# Patient Record
Sex: Male | Born: 1935 | Race: White | Marital: Married | Smoking: Former smoker
Health system: Southern US, Community
[De-identification: ages and names within clinical notes are randomized; demographics above are authoritative.]

## PROBLEM LIST (undated history)

## (undated) DIAGNOSIS — E7800 Pure hypercholesterolemia, unspecified: Secondary | ICD-10-CM

## (undated) DIAGNOSIS — I10 Essential (primary) hypertension: Secondary | ICD-10-CM

## (undated) DIAGNOSIS — J329 Chronic sinusitis, unspecified: Secondary | ICD-10-CM

## (undated) DIAGNOSIS — Z87438 Personal history of other diseases of male genital organs: Secondary | ICD-10-CM

## (undated) DIAGNOSIS — N529 Male erectile dysfunction, unspecified: Secondary | ICD-10-CM

## (undated) DIAGNOSIS — I82409 Acute embolism and thrombosis of unspecified deep veins of unspecified lower extremity: Secondary | ICD-10-CM

## (undated) DIAGNOSIS — C801 Malignant (primary) neoplasm, unspecified: Secondary | ICD-10-CM

## (undated) HISTORY — PX: BLADDER SURGERY: SHX569

---

## 2008-06-16 HISTORY — PX: NASAL POLYP SURGERY: SHX186

## 2019-10-24 ENCOUNTER — Other Ambulatory Visit: Payer: Self-pay | Admitting: Urology

## 2019-10-24 DIAGNOSIS — R972 Elevated prostate specific antigen [PSA]: Secondary | ICD-10-CM

## 2019-11-04 ENCOUNTER — Ambulatory Visit
Admission: RE | Admit: 2019-11-04 | Discharge: 2019-11-04 | Disposition: A | Discharge status: Home / Self Care | Payer: Medicare Other | Source: Ambulatory Visit | Attending: Urology | Admitting: Urology

## 2019-11-04 ENCOUNTER — Other Ambulatory Visit: Payer: Self-pay

## 2019-11-04 DIAGNOSIS — R972 Elevated prostate specific antigen [PSA]: Secondary | ICD-10-CM | POA: Diagnosis present

## 2019-11-04 MED ORDER — GADOBUTROL 1 MMOL/ML IV SOLN
7.0000 mL | Freq: Once | INTRAVENOUS | Status: AC | PRN
  Administered 2019-11-04: 7 mL via INTRAVENOUS

## 2019-12-08 NOTE — H&P (Signed)
NAMEOPIE, MACLAUGHLIN MEDICAL RECORD DZ:[id number] ACCOUNT [id number] DATE OF BIRTH:08/17/1930 FACILITY: [facility] LOCATION:  PHYSICIAN:Nikolus Marczak Farrel Conners, MD  HISTORY AND PHYSICAL  DATE OF ADMISSION:  12/22/2019  CHIEF COMPLAINT:  Elevated PSA.  HISTORY OF PRESENT ILLNESS:  The patient is an 86 year old white male found to have elevated PSA of 7.0 ng/mL on 06/17/2019.  He had a negative biopsy in 2012 for PSA of 6.1.  He has been on finasteride for at least eight years for BPH.  He was evaluated  in the office with an exosome IntelliScore of 57.87 which was above the cutoff for higher risk of high-grade prostate cancer.  MRI scan was also performed indicating that he had a 46 cc prostate with a 1.8 cm PI-RADS category 5 involving the left side  of the prostate and a 1.6 cm PI-RADS category 5 involving the right side of the prostate.  He comes in now for UroNav fusion biopsy of the prostate.  PAST MEDICAL HISTORY:    ALLERGIES:  SULFA DRUGS.  CURRENT MEDICATIONS:  Coenzyme Q, vitamin C, vitamin D3, lycopene, finasteride, arginine, atorvastatin, prednisone, losartan, and Eliquis.  PAST SURGICAL HISTORY:  Removal of nasal polyps in 2010.  PAST AND CURRENT MEDICAL CONDITIONS: 1.  Hypertension. 2.  Hypercholesterolemia. 3.  History of DVT. 4.  Chronic sinusitis. 5.  BPH. 6.  Erectile dysfunction.    REVIEW OF SYSTEMS:  The patient denies chest pain, shortness of breath, diabetes or stroke.  SOCIAL HISTORY:  The patient denied tobacco use.  He quit smoking in 1968 with a 10-pack-year history.  He consumes 7 alcoholic beverages per week.  FAMILY HISTORY:  Negative for prostate cancer.  PHYSICAL EXAMINATION: VITAL SIGNS:  Height 5 feet 8 inches, weight 154 pounds, BMI 23. GENERAL:  Well-nourished, white male in no acute distress. HEENT:  Sclerae were clear.  Pupils are equally round, reactive to light and accommodation.  Extraocular movements were intact. NECK:  No palpable masses or  tenderness.  Thyroid gland was smooth without palpable nodules.  No audible carotid bruits. LYMPHATIC:  No palpable inguinal or cervical adenopathy. PULMONARY:  Lungs clear to auscultation. CARDIOVASCULAR:  Regular rhythm and rate without audible murmurs. ABDOMEN:  Soft, nontender abdomen.  No CVA tenderness. GENITOURINARY:  Circumcised.  Testes were smooth, nontender, 20 mL in size each. RECTAL:  A 25 gram smooth, nontender prostate. NEUROMUSCULAR:  Alert and oriented x3.  IMPRESSION: 1.  Elevated PSA. 2.  Two PI-RADS category 5 lesion of the prostate.  PLAN:  UroNav fusion biopsy of the prostate.  CN/NUANCE  D:12/07/2019 T:12/07/2019 JOB:[id number]

## 2019-12-16 ENCOUNTER — Other Ambulatory Visit
Admission: RE | Admit: 2019-12-16 | Discharge: 2019-12-16 | Disposition: A | Discharge status: Home / Self Care | Payer: Medicare Other | Source: Ambulatory Visit | Attending: Urology | Admitting: Urology

## 2019-12-16 ENCOUNTER — Other Ambulatory Visit: Payer: Self-pay

## 2019-12-16 HISTORY — DX: Pure hypercholesterolemia, unspecified: E78.00

## 2019-12-16 HISTORY — DX: Acute embolism and thrombosis of unspecified deep veins of unspecified lower extremity: I82.409

## 2019-12-16 HISTORY — DX: Malignant (primary) neoplasm, unspecified: C80.1

## 2019-12-16 HISTORY — DX: Essential (primary) hypertension: I10

## 2019-12-16 HISTORY — DX: Male erectile dysfunction, unspecified: N52.9

## 2019-12-16 HISTORY — DX: Chronic sinusitis, unspecified: J32.9

## 2019-12-16 HISTORY — DX: Personal history of other diseases of male genital organs: Z87.438

## 2019-12-16 NOTE — Patient Instructions (Addendum)
COVID TESTING Date: December 20, 2019 Testing site:  [facility] [address] Thru Hours:  0:73 am - 1:00 pm Once you are tested, you are asked to stay quarantined (avoiding public places) until after your surgery.   Your procedure is scheduled on: December 22, 2019 THURSDAY Report to Day Surgery on the 2nd floor of the [facility]. To find out your arrival time, please call [phone] between 1PM - 3PM on: Wednesday December 21, 2019  REMEMBER: Instructions that are not followed completely may result in serious medical risk, up to and including death; or upon the discretion of your surgeon and anesthesiologist your surgery may need to be rescheduled.  Do not eat food after midnight the night before surgery.  No gum chewing, lozengers or hard candies.  You may however, drink CLEAR liquids up to 2 hours before you are scheduled to arrive for your surgery. Do not drink anything within 2 hours of your scheduled arrival time.  Clear liquids include: - water  - apple juice without pulp - gatorade (not RED) - black coffee or tea (Do NOT add milk or creamers to the coffee or tea) Do NOT drink anything that is not on this list.  Type 1 and Type 2 diabetics should only drink water.  ENSURE PRE-SURGERY CARBOHYDRATE DRINK:  Complete drinking 2 hours prior to scheduled arrival time.  TAKE THESE MEDICATIONS THE MORNING OF SURGERY WITH A SIP OF WATER: PREDNISONE   Follow recommendations from Cardiologist, Pulmonologist or PCP regarding stopping Eliquis -STOP ON December 14, 2019  Stop Anti-inflammatories (NSAIDS) such as Advil, Aleve, Ibuprofen, Motrin, Naproxen, Naprosyn and Aspirin based products such as Excedrin, Goodys Powder, BC Powder. (May take Tylenol or Acetaminophen if needed.)  Stop ANY OVER THE COUNTER supplements until after surgery. STOP ALL OTHER SUPPLEMENT (May continue Vitamin D, Vitamin B, and multivitamin.)  No Alcohol for 24 hours before or after  surgery.  No Smoking including e-cigarettes for 24 hours prior to surgery.  No chewable tobacco products for at least 6 hours prior to surgery.  No nicotine patches on the day of surgery.  Do not use any "recreational" drugs for at least a week prior to your surgery.  Please be advised that the combination of cocaine and anesthesia may have negative outcomes, up to and including death. If you test positive for cocaine, your surgery will be cancelled.  On the morning of surgery brush your teeth with toothpaste and water, you may rinse your mouth with mouthwash if you wish. Do not swallow any toothpaste or mouthwash.  Do not wear jewelry, make-up, hairpins, clips or nail polish.  Do not wear lotions, powders, or perfumes.   Do not shave 48 hours prior to surgery.   Contact lenses, hearing aids and dentures may not be worn into surgery.  Do not bring valuables to the hospital. [facility] is not responsible for any missing/lost belongings or valuables.   SHOWER MORNING OF SURGERY.  Fleets enema as directed.  Bring your C-PAP to the hospital with you in case you may have to spend the night.   Notify your doctor if there is any change in your medical condition (cold, fever, infection).  Wear comfortable clothing (specific to your surgery type) to the hospital.  Plan for stool softeners for home use; pain medications have a tendency to cause constipation. You can also help prevent constipation by eating foods high in fiber such as fruits and vegetables and drinking plenty of fluids  as your diet allows.  After surgery, you can help prevent lung complications by doing breathing exercises.  Take deep breaths and cough every 1-2 hours. Your doctor may order a device called an Incentive Spirometer to help you take deep breaths. When coughing or sneezing, hold a pillow firmly against your incision with both hands. This is called "splinting." Doing this helps protect your incision. It also  decreases belly discomfort.  If you are being discharged the day of surgery, you will not be allowed to drive home. You will need a responsible adult (18 years or older) to drive you home and stay with you that night.   If you are taking public transportation, you will need to have a responsible adult (18 years or older) with you. Please confirm with your physician that it is acceptable to use public transportation.   Please call the [place] [facility] at [phone] if you have any questions about these instructions.  Visitation Policy:  Patients undergoing a surgery or procedure may have one family member or support person with them as long as that person is not COVID-19 positive or experiencing its symptoms.  That person may remain in the waiting area during the procedure.  Children under 41 years of age may have both parents or legal guardians with them during their procedure.  Inpatient Visitation Update:   Two designated support people may visit a patient during visiting hours 7 am to 8 pm. It must be the same two designated people for the duration of the patient stay. The visitors may come and go during the day, and there is no switching out to have different visitors. A mask must be worn at all times, including in the patient room.  Children under 36 years of age:  a total of 4 designated visitors for the child's entire stay are allowed. Only 2 in the room at a time and only one staying overnight at a time. The overnight guest can now rotate during the child's hospital stay.  As a reminder, masks are still required for all [facility] team members, patients and visitors in all [place] facilities.   Systemwide, no visitors 17 or younger.

## 2019-12-20 ENCOUNTER — Encounter
Admission: RE | Admit: 2019-12-20 | Discharge: 2019-12-20 | Disposition: A | Discharge status: Home / Self Care | Payer: Medicare Other | Source: Ambulatory Visit | Attending: Urology | Admitting: Urology

## 2019-12-20 ENCOUNTER — Other Ambulatory Visit: Payer: Self-pay

## 2019-12-20 DIAGNOSIS — Z20822 Contact with and (suspected) exposure to covid-19: Secondary | ICD-10-CM | POA: Insufficient documentation

## 2019-12-20 DIAGNOSIS — Z01818 Encounter for other preprocedural examination: Secondary | ICD-10-CM | POA: Diagnosis present

## 2019-12-20 LAB — SARS CORONAVIRUS 2 (TAT 6-24 HRS): SARS Coronavirus 2: NEGATIVE

## 2019-12-21 MED ORDER — GENTAMICIN IN SALINE 1.6-0.9 MG/ML-% IV SOLN
80.0000 mg | INTRAVENOUS | Status: AC
  Administered 2019-12-22: 80 mg via INTRAVENOUS
  Filled 2019-12-21: qty 50

## 2019-12-22 ENCOUNTER — Encounter: Payer: Self-pay | Admitting: Urology

## 2019-12-22 ENCOUNTER — Ambulatory Visit: Payer: Medicare Other | Admitting: Anesthesiology

## 2019-12-22 ENCOUNTER — Encounter
Admission: RE | Disposition: A | Discharge status: Home / Self Care | Payer: Self-pay | Source: Home / Self Care | Attending: Urology

## 2019-12-22 ENCOUNTER — Other Ambulatory Visit: Payer: Self-pay

## 2019-12-22 ENCOUNTER — Ambulatory Visit
Admission: RE | Admit: 2019-12-22 | Discharge: 2019-12-22 | Disposition: A | Discharge status: Home / Self Care | Payer: Medicare Other | Attending: Urology | Admitting: Urology

## 2019-12-22 ENCOUNTER — Ambulatory Visit: Payer: Medicare Other | Admitting: Urgent Care

## 2019-12-22 DIAGNOSIS — R9389 Abnormal findings on diagnostic imaging of other specified body structures: Secondary | ICD-10-CM | POA: Diagnosis present

## 2019-12-22 DIAGNOSIS — K644 Residual hemorrhoidal skin tags: Secondary | ICD-10-CM | POA: Diagnosis not present

## 2019-12-22 DIAGNOSIS — C61 Malignant neoplasm of prostate: Secondary | ICD-10-CM | POA: Diagnosis not present

## 2019-12-22 DIAGNOSIS — N400 Enlarged prostate without lower urinary tract symptoms: Secondary | ICD-10-CM | POA: Insufficient documentation

## 2019-12-22 DIAGNOSIS — Z7952 Long term (current) use of systemic steroids: Secondary | ICD-10-CM | POA: Insufficient documentation

## 2019-12-22 DIAGNOSIS — Z7901 Long term (current) use of anticoagulants: Secondary | ICD-10-CM | POA: Insufficient documentation

## 2019-12-22 DIAGNOSIS — E7800 Pure hypercholesterolemia, unspecified: Secondary | ICD-10-CM | POA: Diagnosis not present

## 2019-12-22 DIAGNOSIS — Z79899 Other long term (current) drug therapy: Secondary | ICD-10-CM | POA: Insufficient documentation

## 2019-12-22 DIAGNOSIS — I10 Essential (primary) hypertension: Secondary | ICD-10-CM | POA: Diagnosis not present

## 2019-12-22 DIAGNOSIS — Z87891 Personal history of nicotine dependence: Secondary | ICD-10-CM | POA: Diagnosis not present

## 2019-12-22 DIAGNOSIS — Z86718 Personal history of other venous thrombosis and embolism: Secondary | ICD-10-CM | POA: Diagnosis not present

## 2019-12-22 DIAGNOSIS — Z882 Allergy status to sulfonamides status: Secondary | ICD-10-CM | POA: Diagnosis not present

## 2019-12-22 DIAGNOSIS — R972 Elevated prostate specific antigen [PSA]: Secondary | ICD-10-CM | POA: Diagnosis present

## 2019-12-22 HISTORY — PX: PROSTATE BIOPSY: SHX241

## 2019-12-22 SURGERY — BIOPSY, PROSTATE
Anesthesia: General

## 2019-12-22 MED ORDER — LACTATED RINGERS IV SOLN: INTRAVENOUS | Status: DC

## 2019-12-22 MED ORDER — SUGAMMADEX SODIUM 200 MG/2ML IV SOLN
INTRAVENOUS | Status: DC | PRN
  Administered 2019-12-22: 200 mg via INTRAVENOUS

## 2019-12-22 MED ORDER — EPHEDRINE SULFATE 50 MG/ML IJ SOLN
INTRAMUSCULAR | Status: DC | PRN
  Administered 2019-12-22: 25 mg via INTRAVENOUS

## 2019-12-22 MED ORDER — CHLORHEXIDINE GLUCONATE 0.12 % MT SOLN
OROMUCOSAL | Status: AC
  Administered 2019-12-22: 15 mL via OROMUCOSAL
  Filled 2019-12-22: qty 15

## 2019-12-22 MED ORDER — PROPOFOL 10 MG/ML IV BOLUS
INTRAVENOUS | Status: AC
  Filled 2019-12-22: qty 20

## 2019-12-22 MED ORDER — LEVOFLOXACIN 500 MG PO TABS: 500.0000 mg | ORAL_TABLET | Freq: Every day | ORAL | 0 refills | Status: AC

## 2019-12-22 MED ORDER — ONDANSETRON HCL 4 MG/2ML IJ SOLN
INTRAMUSCULAR | Status: AC
  Filled 2019-12-22: qty 2

## 2019-12-22 MED ORDER — LIDOCAINE HCL (CARDIAC) PF 100 MG/5ML IV SOSY
PREFILLED_SYRINGE | INTRAVENOUS | Status: DC | PRN
  Administered 2019-12-22: 60 mg via INTRAVENOUS

## 2019-12-22 MED ORDER — DEXAMETHASONE SODIUM PHOSPHATE 10 MG/ML IJ SOLN
INTRAMUSCULAR | Status: DC | PRN
  Administered 2019-12-22: 10 mg via INTRAVENOUS

## 2019-12-22 MED ORDER — FENTANYL CITRATE (PF) 100 MCG/2ML IJ SOLN
INTRAMUSCULAR | Status: AC
  Filled 2019-12-22: qty 2

## 2019-12-22 MED ORDER — FLEET ENEMA 7-19 GM/118ML RE ENEM: 1.0000 | ENEMA | Freq: Once | RECTAL | Status: DC

## 2019-12-22 MED ORDER — DEXAMETHASONE SODIUM PHOSPHATE 10 MG/ML IJ SOLN
INTRAMUSCULAR | Status: AC
  Filled 2019-12-22: qty 1

## 2019-12-22 MED ORDER — CEFAZOLIN SODIUM-DEXTROSE 1-4 GM/50ML-% IV SOLN
INTRAVENOUS | Status: AC
  Filled 2019-12-22: qty 50

## 2019-12-22 MED ORDER — FAMOTIDINE 20 MG PO TABS: 20.0000 mg | ORAL_TABLET | Freq: Once | ORAL | Status: AC

## 2019-12-22 MED ORDER — LIDOCAINE HCL (PF) 2 % IJ SOLN
INTRAMUSCULAR | Status: AC
  Filled 2019-12-22: qty 5

## 2019-12-22 MED ORDER — ROCURONIUM BROMIDE 100 MG/10ML IV SOLN
INTRAVENOUS | Status: DC | PRN
  Administered 2019-12-22: 40 mg via INTRAVENOUS

## 2019-12-22 MED ORDER — FAMOTIDINE 20 MG PO TABS
ORAL_TABLET | ORAL | Status: AC
  Administered 2019-12-22: 20 mg via ORAL
  Filled 2019-12-22: qty 1

## 2019-12-22 MED ORDER — CEFAZOLIN SODIUM-DEXTROSE 1-4 GM/50ML-% IV SOLN
1.0000 g | Freq: Once | INTRAVENOUS | Status: AC
  Administered 2019-12-22: 1 g via INTRAVENOUS

## 2019-12-22 MED ORDER — ROCURONIUM BROMIDE 10 MG/ML (PF) SYRINGE
PREFILLED_SYRINGE | INTRAVENOUS | Status: AC
  Filled 2019-12-22: qty 10

## 2019-12-22 MED ORDER — PROPOFOL 10 MG/ML IV BOLUS
INTRAVENOUS | Status: DC | PRN
  Administered 2019-12-22: 90 mg via INTRAVENOUS

## 2019-12-22 MED ORDER — FENTANYL CITRATE (PF) 100 MCG/2ML IJ SOLN
INTRAMUSCULAR | Status: DC | PRN
  Administered 2019-12-22 (×2): 50 ug via INTRAVENOUS

## 2019-12-22 MED ORDER — ONDANSETRON HCL 4 MG/2ML IJ SOLN
INTRAMUSCULAR | Status: DC | PRN
  Administered 2019-12-22: 4 mg via INTRAVENOUS

## 2019-12-22 MED ORDER — ORAL CARE MOUTH RINSE: 15.0000 mL | Freq: Once | OROMUCOSAL | Status: AC

## 2019-12-22 MED ORDER — CHLORHEXIDINE GLUCONATE 0.12 % MT SOLN: 15.0000 mL | Freq: Once | OROMUCOSAL | Status: AC

## 2019-12-22 SURGICAL SUPPLY — 11 items
COVER MAYO STAND REUSABLE (DRAPES) ×3 IMPLANT
COVER WAND RF STERILE (DRAPES) ×3 IMPLANT
GAUZE SPONGE 4X4 12PLY STRL (GAUZE/BANDAGES/DRESSINGS) ×2 IMPLANT
GLOVE BIO SURGEON STRL SZ7 (GLOVE) ×6 IMPLANT
GUIDE NDL ENDOCAV 16-18 CVR (NEEDLE) IMPLANT
GUIDE NEEDLE ENDOCAV 16-18 CVR (NEEDLE) IMPLANT
INST BIOPSY MAXCORE 18GX25 (NEEDLE) ×3 IMPLANT
NDL GUIDE BIOPSY 644068 (NEEDLE) IMPLANT
NEEDLE GUIDE BIOPSY 644068 (NEEDLE) IMPLANT
SURGILUBE 2OZ TUBE FLIPTOP (MISCELLANEOUS) ×3 IMPLANT
TOWEL OR 17X26 4PK STRL BLUE (TOWEL DISPOSABLE) ×3 IMPLANT

## 2019-12-22 NOTE — Anesthesia Preprocedure Evaluation (Addendum)
Anesthesia Evaluation  Patient identified by MRN, date of birth, ID band Patient awake    Reviewed: Allergy & Precautions, H&P , NPO status , Patient's Chart, lab work & pertinent test results  History of Anesthesia Complications Negative for: history of anesthetic complications  Airway Mallampati: III  TM Distance: <3 FB Neck ROM: limited    Dental  (+) Chipped, Implants   Pulmonary neg pulmonary ROS, neg shortness of breath, former smoker,    Pulmonary exam normal        Cardiovascular Exercise Tolerance: Good hypertension, (-) angina(-) Past MI and (-) DOE Normal cardiovascular exam     Neuro/Psych negative neurological ROS  negative psych ROS   GI/Hepatic negative GI ROS, Neg liver ROS, neg GERD  ,  Endo/Other  negative endocrine ROS  Renal/GU      Musculoskeletal   Abdominal   Peds  Hematology negative hematology ROS (+)   Anesthesia Other Findings Past Medical History: No date: Cancer ([place])     Comment:  bladder No date: DVT (deep venous thrombosis) ([facility])     Comment:  leg-bilateral No date: Erectile dysfunction No date: History of BPH No date: Hypercholesterolemia No date: Hypertension No date: Sinusitis, chronic  Past Surgical History: No date: BLADDER SURGERY     Comment:  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  BMI    Body Mass Index: 21.52 kg/m      Reproductive/Obstetrics negative OB ROS                            Anesthesia Physical Anesthesia Plan  ASA: III  Anesthesia Plan: General ETT   Post-op Pain Management:    Induction: Intravenous  PONV Risk Score and Plan: Ondansetron, Dexamethasone, Midazolam and Treatment may vary due to age or medical condition  Airway Management Planned: Oral ETT  Additional Equipment:   Intra-op Plan:   Post-operative Plan: Extubation in OR  Informed Consent: I have reviewed the patients History and  Physical, chart, labs and discussed the procedure including the risks, benefits and alternatives for the proposed anesthesia with the patient or authorized representative who has indicated his/her understanding and acceptance.     Dental Advisory Given  Plan Discussed with: Anesthesiologist, CRNA and Surgeon  Anesthesia Plan Comments: (Patient stopped taking his anti hypertensive's when he stopped his anti platelets.  Plan to treat hypertension in the OR.  Patient consented for risks of anesthesia including but not limited to:  - adverse reactions to medications - damage to eyes, teeth, lips or other oral mucosa - nerve damage due to positioning  - sore throat or hoarseness - Damage to heart, brain, nerves, lungs, other parts of body or loss of life  Patient voiced understanding.)       Anesthesia Quick Evaluation

## 2019-12-22 NOTE — Progress Notes (Signed)
Dr Kayleen Memos aware of pts BP down at this time, no new orders

## 2019-12-22 NOTE — Transfer of Care (Signed)
Immediate Anesthesia Transfer of Care Note  Patient: Stacy Deshler  Procedure(s) Performed: PROSTATE BIOPSY URONAV (N/A )  Patient Location: PACU  Anesthesia Type:General  Level of Consciousness: awake, oriented and patient cooperative  Airway & Oxygen Therapy: Patient Spontanous Breathing and Patient connected to face mask oxygen  Post-op Assessment: Report given to RN and Post -op Vital signs reviewed and stable  Post vital signs: Reviewed and stable  Last Vitals:  Vitals Value Taken Time  BP 138/104 12/22/19 0921  Temp    Pulse 77 12/22/19 0923  Resp 22 12/22/19 0923  SpO2 93 % 12/22/19 0923  Vitals shown include unvalidated device data.  Last Pain:  Vitals:   12/22/19 0724  TempSrc: Tympanic  PainSc: 0-No pain         Complications: No complications documented.

## 2019-12-22 NOTE — Anesthesia Postprocedure Evaluation (Signed)
Anesthesia Post Note  Patient: Bruce Ruiz  Procedure(s) Performed: PROSTATE BIOPSY URONAV (N/A )  Patient location during evaluation: PACU Anesthesia Type: General Level of consciousness: awake and alert and oriented Pain management: pain level controlled Vital Signs Assessment: post-procedure vital signs reviewed and stable Respiratory status: spontaneous breathing Cardiovascular status: blood pressure returned to baseline Anesthetic complications: no   No complications documented.   Last Vitals:  Vitals:   12/22/19 1000 12/22/19 1029  BP: (!) 171/82 (!) 160/81  Pulse: 76 75  Resp: 18 18  Temp: (!) 36.4 C (!) 36.4 C  SpO2: 94% 98%    Last Pain:  Vitals:   12/22/19 1029  TempSrc: Temporal  PainSc: 0-No pain                 Yuliza Cara

## 2019-12-22 NOTE — H&P (Signed)
Date of Initial H&P: 12/07/19  History reviewed, patient examined, no change in status, stable for surgery.

## 2019-12-22 NOTE — OR Nursing (Signed)
Patient went to restroom and reports passing packing while voiding.

## 2019-12-22 NOTE — Anesthesia Procedure Notes (Signed)
Procedure Name: Intubation Date/Time: 12/22/2019 8:40 AM Performed by: Lia Foyer, CRNA Pre-anesthesia Checklist: Patient identified, Emergency Drugs available, Suction available and Patient being monitored Patient Re-evaluated:Patient Re-evaluated prior to induction Oxygen Delivery Method: Circle system utilized Preoxygenation: Pre-oxygenation with 100% oxygen Induction Type: IV induction Ventilation: Mask ventilation without difficulty Laryngoscope Size: McGraph and 4 Tube type: Oral Tube size: 7.0 mm Number of attempts: 1 Airway Equipment and Method: Stylet,  Oral airway and Video-laryngoscopy Placement Confirmation: ETT inserted through vocal cords under direct vision,  positive ETCO2 and breath sounds checked- equal and bilateral Secured at: 21 cm Tube secured with: Tape Dental Injury: Teeth and Oropharynx as per pre-operative assessment

## 2019-12-22 NOTE — Discharge Instructions (Addendum)
AMBULATORY SURGERY  DISCHARGE INSTRUCTIONS   1) The drugs that you were given will stay in your system until tomorrow so for the next 24 hours you should not:  A) Drive an automobile B) Make any legal decisions C) Drink any alcoholic beverage   2) You may resume regular meals tomorrow.  Today it is better to start with liquids and gradually work up to solid foods.  You may eat anything you prefer, but it is better to start with liquids, then soup and crackers, and gradually work up to solid foods.   3) Please notify your doctor immediately if you have any unusual bleeding, trouble breathing, redness and pain at the surgery site, drainage, fever, or pain not relieved by medication.    4) Additional Instructions:        Please contact your physician with any problems or Same Day Surgery at [phone], Monday through Friday 6 am to 4 pm, or [place] at [facility] number at [phone].AMBULATORY SURGERY      Transrectal Ultrasound-Guided Prostate Biopsy, Care After This sheet gives you information about how to care for yourself after your procedure. Your doctor may also give you more specific instructions. If you have problems or questions, contact your doctor. What can I expect after the procedure? After the procedure, it is common to have:  Pain and discomfort in your butt, especially while sitting.  Pink-colored pee (urine), due to small amounts of blood in the pee.  Burning while peeing (urinating).  Blood in your poop (stool).  Bleeding from your butt.  Blood in your semen. Follow these instructions at home: Medicines  Take over-the-counter and prescription medicines only as told by your doctor.  If you were prescribed antibiotic medicine, take it as told by your doctor. Do not stop taking the antibiotic even if you start to feel better. Activity   Do not drive for 24 hours if you were given a medicine to help you relax (sedative) during your  procedure.  Return to your normal activities as told by your doctor. Ask your doctor what activities are safe for you.  Ask your doctor when it is okay for you to have sex.  Do not lift anything that is heavier than 10 lb (4.5 kg), or the limit that you are told, until your doctor says that it is safe. General instructions   Drink enough water to keep your pee pale yellow.  Watch your pee, poop, and semen for new bleeding or bleeding that gets worse.  Keep all follow-up visits as told by your doctor. This is important. Contact a doctor if you:  Have blood clots in your pee or poop.  Notice that your pee smells bad or unusual.  Have very bad belly pain.  Have trouble peeing.  Notice that your lower belly feels firm.  Have blood in your pee for more than 2 weeks after the procedure.  Have blood in your semen for more than 2 months after the procedure.  Have problems getting an erection.  Feel sick to your stomach (nauseous).  Throw up (vomit).  Have new or worse bleeding in your pee, poop, or semen. Get help right away if you:  Have a fever or chills.  Have bright red pee.  Have very bad pain that does not get better with medicine.  Cannot pee. Summary  After this procedure, it is common to have pain and discomfort around your butt, especially while sitting.  You may have blood in your  pee and poop.  It is common to have blood in your semen for 1-2 months.  If you were prescribed antibiotic medicine, take it as told by your doctor. Do not stop taking the antibiotic even if you start to feel better.  Get help right away if you have a fever or chills. This information is not intended to replace advice given to you by your health care provider. Make sure you discuss any questions you have with your health care provider. Document Revised: 09/22/2018 Document Reviewed: 03/31/2017 Elsevier Patient Education  [place].

## 2019-12-22 NOTE — OR Nursing (Signed)
Dr Yves Dill notified that I am unable to visualize packing.  No new orders may discharge home.  Patient instructed per Dr Yves Dill and nurse that he will pass with stool.  Notify MD if not.

## 2019-12-22 NOTE — Op Note (Signed)
Preoperative diagnosis: 1.  Elevate PSA (R97.2)                                             2.  Abnormal prostate MRI scan  Postoperative diagnosis: Same  Procedure: Uronav transrectal ultrasound prostate biopsy (CPT [id number],                                   [place], [phone])  Surgeon: Otelia Limes. Yves Dill MD  Anesthesia: General  Indications:See the history and physical. After informed consent the above procedure(s) were requested     Technique and findings: After adequate general anesthesia had been obtained the patient was placed into left lateral decubitus position.  Finger sweep of the rectum was performed and rectal vault was noted to be clear.  Patient had a large posterior external hemorrhoid.  The transrectal ultrasound probe was placed and prostate images acquired.  The ultrasound images were then fused to the MRI images.  Region of interest #1 was identified and 3 core biopsies performed from this location.  Region of interest #2 was then identified and 3 core biopsies obtained from this location.  At this point, standard 12 core systematic biopsies were performed.  The transrectal ultrasound probe was removed and some bleeding was encountered and finger compression performed.  Several 4 x 4's were then rolled up and placed in the rectal vault for further compression.  Estimated blood loss was 30 cc.  The procedure was then terminated and patient transferred to the recovery room in stable condition.

## 2019-12-22 NOTE — OR Nursing (Signed)
Per Dr. Yves Dill secure chat, pt may resume eliquis on Saturday.

## 2019-12-23 ENCOUNTER — Encounter: Payer: Self-pay | Admitting: Urology

## 2019-12-26 LAB — SURGICAL PATHOLOGY

## 2022-04-30 IMAGING — MR MR PROSTATE WO/W CM
56 series · 56 of 56 positions shown · IV contrast (7ml Gadavist)
Comparison: None.

CLINICAL DATA: Elevated PSA.

EXAM:
MR PROSTATE WITHOUT AND WITH CONTRAST
TECHNIQUE: Multiplanar multisequence MRI images were obtained of the pelvis
centered about the prostate. Pre and post contrast images were
obtained.
CONTRAST:  7mL GADAVIST GADOBUTROL 1 MMOL/ML IV SOLN

[Series 3: ax in&out whole · axial · 6.0mm · 0.74mm/px · 1 of 35 slices shown (1 of 2)]
[im 1/35]
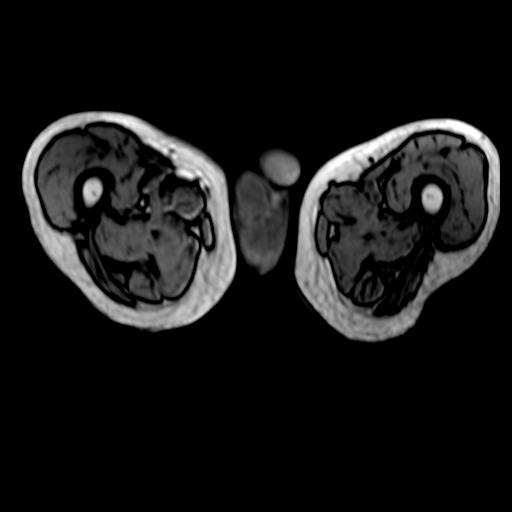

[Series 3: ax in&out whole · axial · 6.0mm · 0.74mm/px · 1 of 35 slices shown (2 of 2)]
[im 1/35]
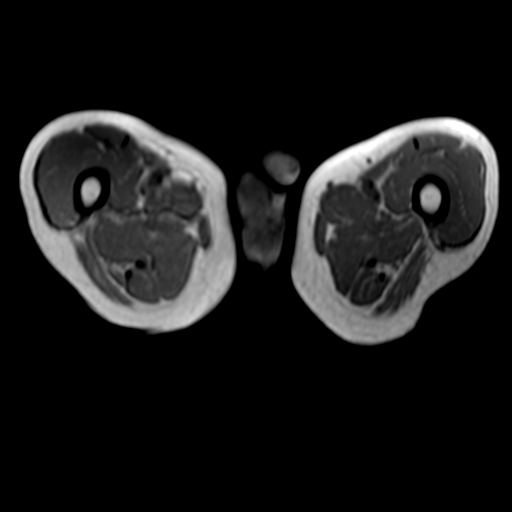

[Series 4: T2 · axial · 3.0mm · 0.56mm/px · 1 of 29 slices shown (1 of 3)]
[im 1/29]
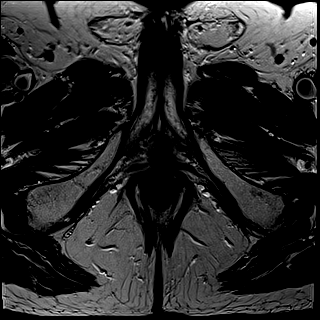

[Series 5: T2 · coronal · 3.0mm · 0.70mm/px · 1 of 35 slices shown (2 of 3)]
[im 1/35]
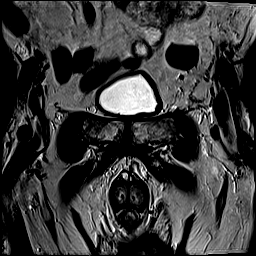

[Series 6: DWI · axial · 3.0mm · 0.86mm/px · 1 of 75 slices shown (1 of 3)]
[im 1/75]
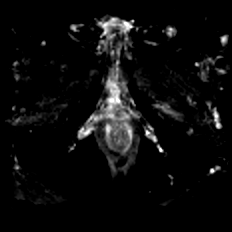

[Series 7: DWI · axial · 3.0mm · 0.86mm/px · 1 of 25 slices shown (2 of 3)]
[im 1/25]
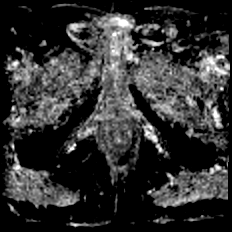

[Series 8: DWI · axial · 3.0mm · 0.86mm/px · 1 of 24 slices shown (3 of 3)]
[im 1/24]
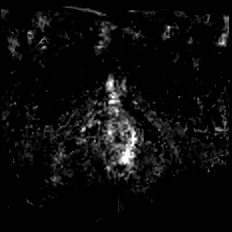

[Series 9: T2 · axial · 1.0mm · 1.04mm/px · 1 of 80 slices shown (3 of 3)]
[im 1/80]
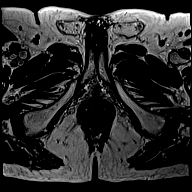

[Series 10: T1 · axial · 3.0mm · 1.15mm/px · 1 of 28 slices shown (1 of 48)]
[im 1/28]
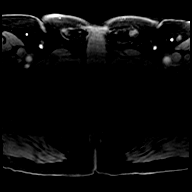

[Series 11: T1 · axial · 3.0mm · 1.15mm/px · 1 of 28 slices shown (2 of 48)]
[im 1/28]
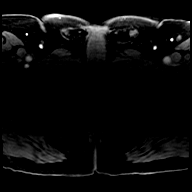

[Series 12: T1 · axial · 3.0mm · 1.15mm/px · 1 of 28 slices shown (3 of 48)]
[im 1/28]
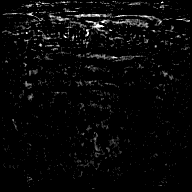

[Series 13: T1 · axial · 3.0mm · 1.15mm/px · 1 of 28 slices shown (4 of 48)]
[im 1/28]
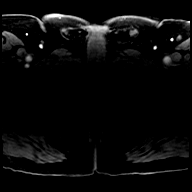

[Series 14: T1 · axial · 3.0mm · 1.15mm/px · 1 of 26 slices shown (5 of 48)]
[im 1/26]
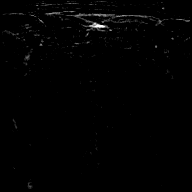

[Series 15: T1 · axial · 3.0mm · 1.15mm/px · 1 of 28 slices shown (6 of 48)]
[im 1/28]
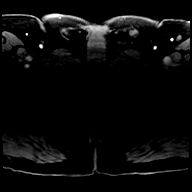

[Series 16: T1 · axial · 3.0mm · 1.15mm/px · 1 of 28 slices shown (7 of 48)]
[im 1/28]
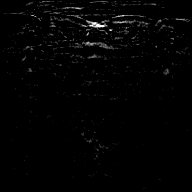

[Series 17: T1 · axial · 3.0mm · 1.15mm/px · 1 of 28 slices shown (8 of 48)]
[im 1/28]
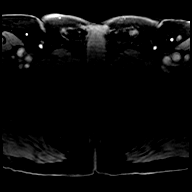

[Series 18: T1 · axial · 3.0mm · 1.15mm/px · 1 of 27 slices shown (9 of 48)]
[im 1/27]
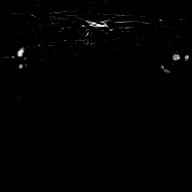

[Series 19: T1 · axial · 3.0mm · 1.15mm/px · 1 of 28 slices shown (10 of 48)]
[im 1/28]
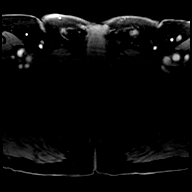

[Series 20: T1 · axial · 3.0mm · 1.15mm/px · 1 of 28 slices shown (11 of 48)]
[im 1/28]
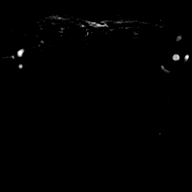

[Series 21: T1 · axial · 3.0mm · 1.15mm/px · 1 of 28 slices shown (12 of 48)]
[im 1/28]
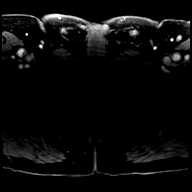

[Series 22: T1 · axial · 3.0mm · 1.15mm/px · 1 of 28 slices shown (13 of 48)]
[im 1/28]
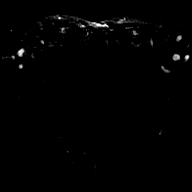

[Series 23: T1 · axial · 3.0mm · 1.15mm/px · 1 of 28 slices shown (14 of 48)]
[im 1/28]
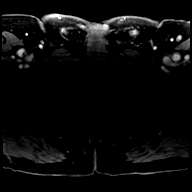

[Series 24: T1 · axial · 3.0mm · 1.15mm/px · 1 of 28 slices shown (15 of 48)]
[im 1/28]
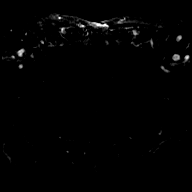

[Series 25: T1 · axial · 3.0mm · 1.15mm/px · 1 of 28 slices shown (16 of 48)]
[im 1/28]
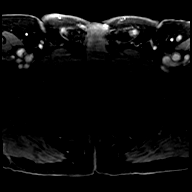

[Series 26: T1 · axial · 3.0mm · 1.15mm/px · 1 of 28 slices shown (17 of 48)]
[im 1/28]
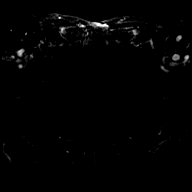

[Series 27: T1 · axial · 3.0mm · 1.15mm/px · 1 of 28 slices shown (18 of 48)]
[im 1/28]
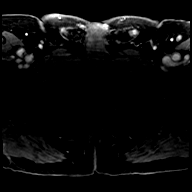

[Series 28: T1 · axial · 3.0mm · 1.15mm/px · 1 of 28 slices shown (19 of 48)]
[im 1/28]
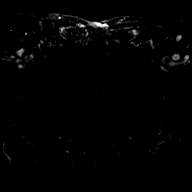

[Series 29: T1 · axial · 3.0mm · 1.15mm/px · 1 of 28 slices shown (20 of 48)]
[im 1/28]
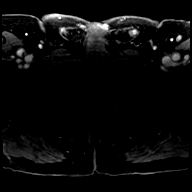

[Series 30: T1 · axial · 3.0mm · 1.15mm/px · 1 of 28 slices shown (21 of 48)]
[im 1/28]
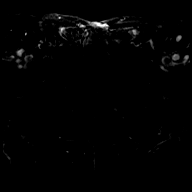

[Series 31: T1 · axial · 3.0mm · 1.15mm/px · 1 of 28 slices shown (22 of 48)]
[im 1/28]
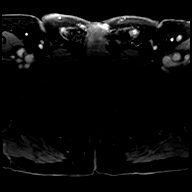

[Series 32: T1 · axial · 3.0mm · 1.15mm/px · 1 of 28 slices shown (23 of 48)]
[im 1/28]
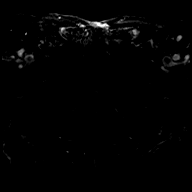

[Series 33: T1 · axial · 3.0mm · 1.15mm/px · 1 of 28 slices shown (24 of 48)]
[im 1/28]
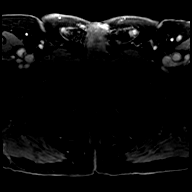

[Series 34: T1 · axial · 3.0mm · 1.15mm/px · 1 of 28 slices shown (25 of 48)]
[im 1/28]
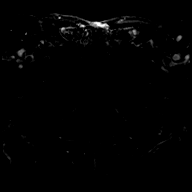

[Series 35: T1 · axial · 3.0mm · 1.15mm/px · 1 of 28 slices shown (26 of 48)]
[im 1/28]
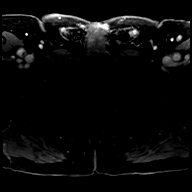

[Series 36: T1 · axial · 3.0mm · 1.15mm/px · 1 of 28 slices shown (27 of 48)]
[im 1/28]
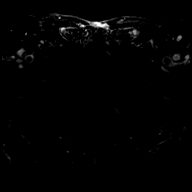

[Series 37: T1 · axial · 3.0mm · 1.15mm/px · 1 of 28 slices shown (28 of 48)]
[im 1/28]
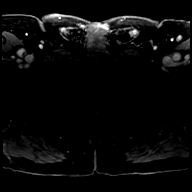

[Series 38: T1 · axial · 3.0mm · 1.15mm/px · 1 of 28 slices shown (29 of 48)]
[im 1/28]
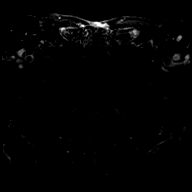

[Series 39: T1 · axial · 3.0mm · 1.15mm/px · 1 of 28 slices shown (30 of 48)]
[im 1/28]
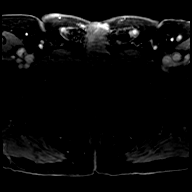

[Series 40: T1 · axial · 3.0mm · 1.15mm/px · 1 of 28 slices shown (31 of 48)]
[im 1/28]
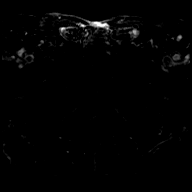

[Series 41: T1 · axial · 3.0mm · 1.15mm/px · 1 of 28 slices shown (32 of 48)]
[im 1/28]
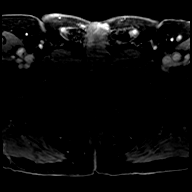

[Series 42: T1 · axial · 3.0mm · 1.15mm/px · 1 of 28 slices shown (33 of 48)]
[im 1/28]
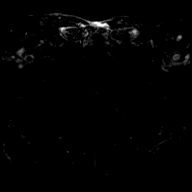

[Series 43: T1 · axial · 3.0mm · 1.15mm/px · 1 of 28 slices shown (34 of 48)]
[im 1/28]
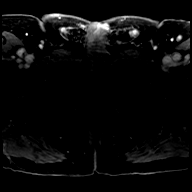

[Series 44: T1 · axial · 3.0mm · 1.15mm/px · 1 of 27 slices shown (35 of 48)]
[im 1/27]
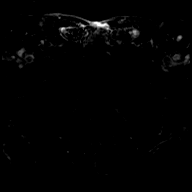

[Series 45: T1 · axial · 3.0mm · 1.15mm/px · 1 of 28 slices shown (36 of 48)]
[im 1/28]
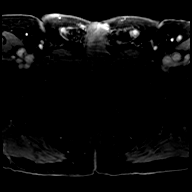

[Series 46: T1 · axial · 3.0mm · 1.15mm/px · 1 of 28 slices shown (37 of 48)]
[im 1/28]
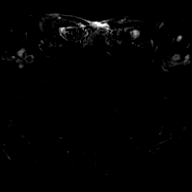

[Series 47: T1 · axial · 3.0mm · 1.15mm/px · 1 of 28 slices shown (38 of 48)]
[im 1/28]
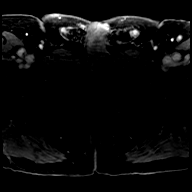

[Series 48: T1 · axial · 3.0mm · 1.15mm/px · 1 of 27 slices shown (39 of 48)]
[im 1/27]
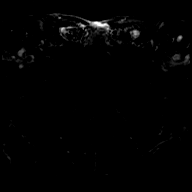

[Series 49: T1 · axial · 3.0mm · 1.15mm/px · 1 of 28 slices shown (40 of 48)]
[im 1/28]
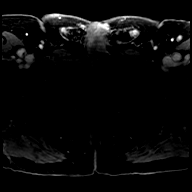

[Series 50: T1 · axial · 3.0mm · 1.15mm/px · 1 of 28 slices shown (41 of 48)]
[im 1/28]
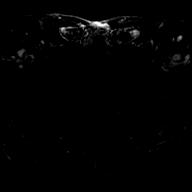

[Series 51: T1 · axial · 3.0mm · 1.15mm/px · 1 of 28 slices shown (42 of 48)]
[im 1/28]
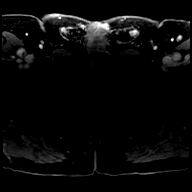

[Series 52: T1 · axial · 3.0mm · 1.15mm/px · 1 of 28 slices shown (43 of 48)]
[im 1/28]
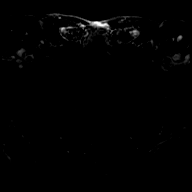

[Series 53: T1 · axial · 3.0mm · 1.15mm/px · 1 of 28 slices shown (44 of 48)]
[im 1/28]
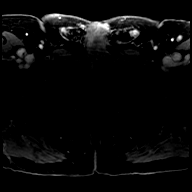

[Series 54: T1 · axial · 3.0mm · 1.15mm/px · 1 of 28 slices shown (45 of 48)]
[im 1/28]
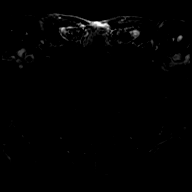

[Series 55: T1 · axial · 3.0mm · 1.15mm/px · 1 of 28 slices shown (46 of 48)]
[im 1/28]
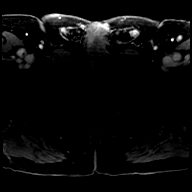

[Series 56: T1 · axial · 3.0mm · 1.15mm/px · 1 of 28 slices shown (47 of 48)]
[im 1/28]
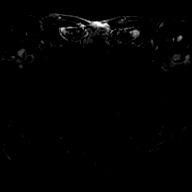

[Series 57: T1 · axial · 3.0mm · 1.15mm/px · 1 of 28 slices shown (48 of 48)]
[im 1/28]
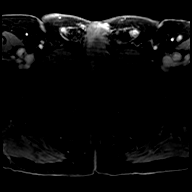

[56 of 56 positions shown; findings below may reference images not displayed]

FINDINGS: Prostate:

-- Peripheral Zone: T2 hypointense nodule is seen in the left
posterior base and mid gland which measures 1.8 x 1.4 cm on image
41/9. This nodule shows marked ADC hypointensity DWI hyperintensity,
as well as early focal contrast enhancement.

A T2 hypointense nodule is also seen in the right posterior base
which measures 1.6 x 1.3 cm on image 41/9. This nodule shows marked
ADC hypointensity and DWI hyperintensity, as well as early focal
contrast enhancement.

-- Transition/Central Zone: Several small circumscribed BPH nodules
are noted, but no suspicious nodules with obscured or
non-circumscribed margins seen.

-- Measurements/Volume:  4.3 x 4.0 x 5.1 cm (volume = 46 cm^3)

Transcapsular spread: Both peripheral zone nodules in the left and
right posterior base show evidence of extracapsular extension best
seen on coronal image [DATE].

Seminal vesicle involvement: Probable bilateral seminal vesicle
involvement.

Neurovascular bundle involvement:  Absent

Pelvic adenopathy: None visualized

Bone metastasis: None visualized

Other:  None
IMPRESSION: 1. 1.8 cm peripheral zone nodule in the left posterior base and mid
gland, which shows evidence of extracapsular extension and probable
left seminal vesicle involvement. PI-RADS 5: Very high (clinically
significant cancer is highly likely to be present).
2. 1.6 cm peripheral zone nodule in the right posterior base, which
shows evidence of extracapsular extension and probable right seminal
vesicle involvement. PI-RADS 5: Very high (clinically significant
cancer is highly likely to be present).
3. No evidence of pelvic lymphadenopathy or bone metastases.

(I have processed this exam in the [employer] application for MR/US
fusion-guided biopsy if performed.)
